# Patient Record
Sex: Male | Born: 1990 | Hispanic: Yes | Marital: Single | Smoking: Current every day smoker
Health system: Southern US, Community
[De-identification: ages and names within clinical notes are randomized; demographics above are authoritative.]

## PROBLEM LIST (undated history)

## (undated) DIAGNOSIS — S82839A Other fracture of upper and lower end of unspecified fibula, initial encounter for closed fracture: Secondary | ICD-10-CM

## (undated) HISTORY — PX: NO PAST SURGERIES: SHX2092

---

## 2016-06-09 ENCOUNTER — Emergency Department (HOSPITAL_COMMUNITY)
Admission: EM | Admit: 2016-06-09 | Discharge: 2016-06-09 | Disposition: A | Discharge status: Home / Self Care | Payer: Self-pay | Attending: Emergency Medicine | Admitting: Emergency Medicine

## 2016-06-09 ENCOUNTER — Emergency Department (HOSPITAL_COMMUNITY): Payer: Self-pay

## 2016-06-09 ENCOUNTER — Encounter (HOSPITAL_COMMUNITY): Payer: Self-pay | Admitting: Emergency Medicine

## 2016-06-09 DIAGNOSIS — Y939 Activity, unspecified: Secondary | ICD-10-CM | POA: Insufficient documentation

## 2016-06-09 DIAGNOSIS — Y929 Unspecified place or not applicable: Secondary | ICD-10-CM | POA: Insufficient documentation

## 2016-06-09 DIAGNOSIS — S43004A Unspecified dislocation of right shoulder joint, initial encounter: Secondary | ICD-10-CM

## 2016-06-09 DIAGNOSIS — Y999 Unspecified external cause status: Secondary | ICD-10-CM | POA: Insufficient documentation

## 2016-06-09 DIAGNOSIS — W11XXXA Fall on and from ladder, initial encounter: Secondary | ICD-10-CM | POA: Insufficient documentation

## 2016-06-09 DIAGNOSIS — S43101A Unspecified dislocation of right acromioclavicular joint, initial encounter: Secondary | ICD-10-CM | POA: Insufficient documentation

## 2016-06-09 DIAGNOSIS — S0001XA Abrasion of scalp, initial encounter: Secondary | ICD-10-CM | POA: Insufficient documentation

## 2016-06-09 DIAGNOSIS — F17210 Nicotine dependence, cigarettes, uncomplicated: Secondary | ICD-10-CM | POA: Insufficient documentation

## 2016-06-09 MED ORDER — HYDROCODONE-ACETAMINOPHEN 5-325 MG PO TABS: ORAL_TABLET | ORAL | 0 refills | Status: DC

## 2016-06-09 MED ORDER — HYDROMORPHONE HCL 1 MG/ML IJ SOLN
1.0000 mg | Freq: Once | INTRAMUSCULAR | Status: AC
  Administered 2016-06-09: 1 mg via INTRAVENOUS
  Filled 2016-06-09: qty 1

## 2016-06-09 MED ORDER — FENTANYL CITRATE (PF) 100 MCG/2ML IJ SOLN
INTRAMUSCULAR | Status: AC
  Administered 2016-06-09: 50 ug via INTRAVENOUS
  Filled 2016-06-09: qty 2

## 2016-06-09 MED ORDER — METHOCARBAMOL 500 MG PO TABS: 1000.0000 mg | ORAL_TABLET | Freq: Four times a day (QID) | ORAL | 0 refills | Status: DC | PRN

## 2016-06-09 MED ORDER — HYDROMORPHONE HCL 1 MG/ML IJ SOLN
1.0000 mg | Freq: Once | INTRAMUSCULAR | Status: AC
  Administered 2016-06-09: 1 mg via INTRAVENOUS

## 2016-06-09 MED ORDER — FENTANYL CITRATE (PF) 100 MCG/2ML IJ SOLN
50.0000 ug | Freq: Once | INTRAMUSCULAR | Status: AC
  Administered 2016-06-09: 50 ug via INTRAVENOUS

## 2016-06-09 MED ORDER — HYDROMORPHONE HCL 1 MG/ML IJ SOLN
INTRAMUSCULAR | Status: AC
  Administered 2016-06-09: 1 mg via INTRAVENOUS
  Filled 2016-06-09: qty 1

## 2016-06-09 NOTE — ED Triage Notes (Signed)
Pt reports falling off of a ladder about 12 feet.  C/o right shoulder pain.
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
## 2016-06-09 NOTE — Discharge Instructions (Signed)
Take the prescriptions as directed.  Apply moist heat or ice to the area(s) of discomfort, for 15 minutes at a time, several times per day for the next few days.  Do not fall asleep on a heating or ice pack. Wear the sling until you are seen in follow up.  Call the Orthopedic doctor today to schedule a follow up appointment within the next week.  Return to the Emergency Department immediately if worsening.

## 2016-06-09 NOTE — ED Notes (Signed)
Pt in CT at this time.

## 2017-10-12 ENCOUNTER — Emergency Department (HOSPITAL_COMMUNITY): Payer: Medicaid Other

## 2017-10-12 ENCOUNTER — Other Ambulatory Visit: Payer: Self-pay

## 2017-10-12 ENCOUNTER — Encounter (HOSPITAL_COMMUNITY): Payer: Self-pay

## 2017-10-12 DIAGNOSIS — Z5321 Procedure and treatment not carried out due to patient leaving prior to being seen by health care provider: Secondary | ICD-10-CM | POA: Insufficient documentation

## 2017-10-12 DIAGNOSIS — M25511 Pain in right shoulder: Secondary | ICD-10-CM | POA: Insufficient documentation

## 2017-10-12 DIAGNOSIS — M25572 Pain in left ankle and joints of left foot: Secondary | ICD-10-CM | POA: Insufficient documentation
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
## 2017-10-13 ENCOUNTER — Other Ambulatory Visit: Payer: Self-pay

## 2017-10-13 ENCOUNTER — Emergency Department (HOSPITAL_COMMUNITY)
Admission: EM | Admit: 2017-10-13 | Discharge: 2017-10-13 | Disposition: A | Discharge status: Home / Self Care | Payer: Medicaid Other | Attending: Emergency Medicine | Admitting: Emergency Medicine

## 2017-10-13 ENCOUNTER — Encounter (HOSPITAL_COMMUNITY): Payer: Self-pay

## 2017-10-13 ENCOUNTER — Emergency Department (HOSPITAL_COMMUNITY): Payer: Medicaid Other

## 2017-10-13 ENCOUNTER — Emergency Department (HOSPITAL_COMMUNITY)
Admission: EM | Admit: 2017-10-13 | Discharge: 2017-10-13 | Disposition: A | Discharge status: Home / Self Care | Payer: Medicaid Other | Source: Home / Self Care

## 2017-10-13 DIAGNOSIS — Y9389 Activity, other specified: Secondary | ICD-10-CM | POA: Insufficient documentation

## 2017-10-13 DIAGNOSIS — S89392A Other physeal fracture of lower end of left fibula, initial encounter for closed fracture: Secondary | ICD-10-CM | POA: Diagnosis not present

## 2017-10-13 DIAGNOSIS — F17210 Nicotine dependence, cigarettes, uncomplicated: Secondary | ICD-10-CM | POA: Diagnosis not present

## 2017-10-13 DIAGNOSIS — X500XXA Overexertion from strenuous movement or load, initial encounter: Secondary | ICD-10-CM | POA: Insufficient documentation

## 2017-10-13 DIAGNOSIS — Y9289 Other specified places as the place of occurrence of the external cause: Secondary | ICD-10-CM | POA: Insufficient documentation

## 2017-10-13 DIAGNOSIS — S82892A Other fracture of left lower leg, initial encounter for closed fracture: Secondary | ICD-10-CM

## 2017-10-13 DIAGNOSIS — Y999 Unspecified external cause status: Secondary | ICD-10-CM | POA: Insufficient documentation

## 2017-10-13 DIAGNOSIS — S99912A Unspecified injury of left ankle, initial encounter: Secondary | ICD-10-CM | POA: Diagnosis present

## 2017-10-13 DIAGNOSIS — S82839A Other fracture of upper and lower end of unspecified fibula, initial encounter for closed fracture: Secondary | ICD-10-CM

## 2017-10-13 HISTORY — DX: Other fracture of upper and lower end of unspecified fibula, initial encounter for closed fracture: S82.839A

## 2017-10-13 MED ORDER — TRAMADOL HCL 50 MG PO TABS: 50.0000 mg | ORAL_TABLET | Freq: Four times a day (QID) | ORAL | 0 refills | Status: DC | PRN

## 2017-10-13 MED ORDER — KETOROLAC TROMETHAMINE 30 MG/ML IJ SOLN
15.0000 mg | Freq: Once | INTRAMUSCULAR | Status: AC
  Administered 2017-10-13: 15 mg via INTRAMUSCULAR
  Filled 2017-10-13: qty 1

## 2017-10-13 MED ORDER — IBUPROFEN 400 MG PO TABS: 400.0000 mg | ORAL_TABLET | Freq: Three times a day (TID) | ORAL | 0 refills | Status: AC

## 2017-10-13 NOTE — Discharge Instructions (Signed)
As discussed, with your ankle fracture is very important that you follow-up with our orthopedic physicians, and wear your provided cam walker while moving the ankle.  Please monitor your condition carefully, and return here for concerning changes.
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
## 2017-10-13 NOTE — ED Notes (Signed)
Unable to make follow up call  No phone number given 10/13/17  1408  s Jaizon Deroos rn
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
## 2017-10-17 ENCOUNTER — Encounter (HOSPITAL_COMMUNITY): Payer: Self-pay

## 2017-10-20 ENCOUNTER — Other Ambulatory Visit: Payer: Self-pay

## 2017-10-20 ENCOUNTER — Other Ambulatory Visit: Payer: Self-pay | Admitting: Orthopedic Surgery

## 2017-10-20 ENCOUNTER — Encounter (HOSPITAL_BASED_OUTPATIENT_CLINIC_OR_DEPARTMENT_OTHER): Payer: Self-pay | Admitting: *Deleted

## 2017-10-23 ENCOUNTER — Ambulatory Visit (HOSPITAL_BASED_OUTPATIENT_CLINIC_OR_DEPARTMENT_OTHER): Payer: Medicaid Other | Admitting: Anesthesiology

## 2017-10-23 ENCOUNTER — Encounter (HOSPITAL_BASED_OUTPATIENT_CLINIC_OR_DEPARTMENT_OTHER)
Admission: RE | Disposition: A | Discharge status: Home / Self Care | Payer: Self-pay | Source: Ambulatory Visit | Attending: Orthopedic Surgery

## 2017-10-23 ENCOUNTER — Ambulatory Visit (HOSPITAL_BASED_OUTPATIENT_CLINIC_OR_DEPARTMENT_OTHER)
Admission: RE | Admit: 2017-10-23 | Discharge: 2017-10-23 | Disposition: A | Discharge status: Home / Self Care | Payer: Medicaid Other | Source: Ambulatory Visit | Attending: Orthopedic Surgery | Admitting: Orthopedic Surgery

## 2017-10-23 ENCOUNTER — Other Ambulatory Visit: Payer: Self-pay

## 2017-10-23 ENCOUNTER — Encounter (HOSPITAL_BASED_OUTPATIENT_CLINIC_OR_DEPARTMENT_OTHER): Payer: Self-pay

## 2017-10-23 DIAGNOSIS — F17210 Nicotine dependence, cigarettes, uncomplicated: Secondary | ICD-10-CM | POA: Insufficient documentation

## 2017-10-23 DIAGNOSIS — Z791 Long term (current) use of non-steroidal anti-inflammatories (NSAID): Secondary | ICD-10-CM | POA: Insufficient documentation

## 2017-10-23 DIAGNOSIS — W19XXXA Unspecified fall, initial encounter: Secondary | ICD-10-CM | POA: Insufficient documentation

## 2017-10-23 DIAGNOSIS — S8262XA Displaced fracture of lateral malleolus of left fibula, initial encounter for closed fracture: Secondary | ICD-10-CM | POA: Diagnosis not present

## 2017-10-23 DIAGNOSIS — S82452A Displaced comminuted fracture of shaft of left fibula, initial encounter for closed fracture: Secondary | ICD-10-CM

## 2017-10-23 HISTORY — PX: ORIF ANKLE FRACTURE: SHX5408

## 2017-10-23 HISTORY — DX: Other fracture of upper and lower end of unspecified fibula, initial encounter for closed fracture: S82.839A

## 2017-10-23 SURGERY — OPEN REDUCTION INTERNAL FIXATION (ORIF) ANKLE FRACTURE
Anesthesia: General | Site: Ankle | Laterality: Left

## 2017-10-23 MED ORDER — PROPOFOL 10 MG/ML IV BOLUS
INTRAVENOUS | Status: AC
  Filled 2017-10-23: qty 20

## 2017-10-23 MED ORDER — OXYCODONE-ACETAMINOPHEN 5-325 MG PO TABS: 1.0000 | ORAL_TABLET | Freq: Four times a day (QID) | ORAL | 0 refills | Status: DC | PRN

## 2017-10-23 MED ORDER — SCOPOLAMINE 1 MG/3DAYS TD PT72: 1.0000 | MEDICATED_PATCH | Freq: Once | TRANSDERMAL | Status: DC | PRN

## 2017-10-23 MED ORDER — PROPOFOL 10 MG/ML IV BOLUS
INTRAVENOUS | Status: DC | PRN
  Administered 2017-10-23: 30 mg via INTRAVENOUS
  Administered 2017-10-23: 300 mg via INTRAVENOUS

## 2017-10-23 MED ORDER — ONDANSETRON HCL 4 MG/2ML IJ SOLN
INTRAMUSCULAR | Status: AC
  Filled 2017-10-23: qty 2

## 2017-10-23 MED ORDER — PHENYLEPHRINE HCL 10 MG/ML IJ SOLN
INTRAMUSCULAR | Status: DC | PRN
  Administered 2017-10-23: 80 ug via INTRAVENOUS

## 2017-10-23 MED ORDER — ROPIVACAINE HCL 7.5 MG/ML IJ SOLN
INTRAMUSCULAR | Status: DC | PRN
  Administered 2017-10-23: 25 mL via PERINEURAL
  Administered 2017-10-23: 15 mL via PERINEURAL

## 2017-10-23 MED ORDER — CEFAZOLIN SODIUM-DEXTROSE 2-4 GM/100ML-% IV SOLN: 2.0000 g | INTRAVENOUS | Status: DC

## 2017-10-23 MED ORDER — ASPIRIN EC 325 MG PO TBEC: 325.0000 mg | DELAYED_RELEASE_TABLET | Freq: Every day | ORAL | 0 refills | Status: AC

## 2017-10-23 MED ORDER — MIDAZOLAM HCL 2 MG/2ML IJ SOLN
1.0000 mg | INTRAMUSCULAR | Status: DC | PRN
  Administered 2017-10-23 (×2): 2 mg via INTRAVENOUS

## 2017-10-23 MED ORDER — OXYCODONE HCL 5 MG PO TABS: 5.0000 mg | ORAL_TABLET | Freq: Once | ORAL | Status: DC | PRN

## 2017-10-23 MED ORDER — OXYCODONE HCL 5 MG/5ML PO SOLN: 5.0000 mg | Freq: Once | ORAL | Status: DC | PRN

## 2017-10-23 MED ORDER — LIDOCAINE HCL (CARDIAC) PF 100 MG/5ML IV SOSY
PREFILLED_SYRINGE | INTRAVENOUS | Status: DC | PRN
  Administered 2017-10-23: 100 mg via INTRAVENOUS

## 2017-10-23 MED ORDER — MIDAZOLAM HCL 2 MG/2ML IJ SOLN
INTRAMUSCULAR | Status: AC
  Filled 2017-10-23: qty 2

## 2017-10-23 MED ORDER — ONDANSETRON HCL 4 MG/2ML IJ SOLN
INTRAMUSCULAR | Status: DC | PRN
  Administered 2017-10-23: 4 mg via INTRAVENOUS

## 2017-10-23 MED ORDER — LIDOCAINE HCL (CARDIAC) PF 100 MG/5ML IV SOSY
PREFILLED_SYRINGE | INTRAVENOUS | Status: AC
  Filled 2017-10-23: qty 5

## 2017-10-23 MED ORDER — CHLORHEXIDINE GLUCONATE 4 % EX LIQD: 60.0000 mL | Freq: Once | CUTANEOUS | Status: DC

## 2017-10-23 MED ORDER — FENTANYL CITRATE (PF) 100 MCG/2ML IJ SOLN: 25.0000 ug | INTRAMUSCULAR | Status: DC | PRN

## 2017-10-23 MED ORDER — DEXAMETHASONE SODIUM PHOSPHATE 4 MG/ML IJ SOLN
INTRAMUSCULAR | Status: DC | PRN
  Administered 2017-10-23: 10 mg via INTRAVENOUS

## 2017-10-23 MED ORDER — OXYCODONE-ACETAMINOPHEN 5-325 MG PO TABS: 1.0000 | ORAL_TABLET | ORAL | 0 refills | Status: DC | PRN

## 2017-10-23 MED ORDER — CEFAZOLIN SODIUM-DEXTROSE 2-4 GM/100ML-% IV SOLN
INTRAVENOUS | Status: AC
  Filled 2017-10-23: qty 100

## 2017-10-23 MED ORDER — FENTANYL CITRATE (PF) 100 MCG/2ML IJ SOLN
INTRAMUSCULAR | Status: AC
  Filled 2017-10-23: qty 2

## 2017-10-23 MED ORDER — DEXAMETHASONE SODIUM PHOSPHATE 10 MG/ML IJ SOLN
INTRAMUSCULAR | Status: AC
  Filled 2017-10-23: qty 1

## 2017-10-23 MED ORDER — SUCCINYLCHOLINE CHLORIDE 20 MG/ML IJ SOLN
INTRAMUSCULAR | Status: DC | PRN
  Administered 2017-10-23: 140 mg via INTRAVENOUS

## 2017-10-23 MED ORDER — FENTANYL CITRATE (PF) 100 MCG/2ML IJ SOLN
50.0000 ug | INTRAMUSCULAR | Status: DC | PRN
  Administered 2017-10-23: 100 ug via INTRAVENOUS
  Administered 2017-10-23: 50 ug via INTRAVENOUS

## 2017-10-23 MED ORDER — LACTATED RINGERS IV SOLN
INTRAVENOUS | Status: DC
  Administered 2017-10-23 (×2): via INTRAVENOUS

## 2017-10-23 MED ORDER — LACTATED RINGERS IV SOLN: INTRAVENOUS | Status: DC

## 2017-10-23 MED ORDER — PROMETHAZINE HCL 25 MG/ML IJ SOLN: 6.2500 mg | INTRAMUSCULAR | Status: DC | PRN

## 2017-10-23 SURGICAL SUPPLY — 75 items
BANDAGE ACE 4X5 VEL STRL LF (GAUZE/BANDAGES/DRESSINGS) ×3 IMPLANT
BANDAGE ACE 6X5 VEL STRL LF (GAUZE/BANDAGES/DRESSINGS) ×3 IMPLANT
BANDAGE ESMARK 6X9 LF (GAUZE/BANDAGES/DRESSINGS) ×1 IMPLANT
BENZOIN TINCTURE PRP APPL 2/3 (GAUZE/BANDAGES/DRESSINGS) IMPLANT
BIT DRILL 2.5X2.75 QC CALB (BIT) ×3 IMPLANT
BIT DRILL 3.5X5.5 QC CALB (BIT) ×3 IMPLANT
BLADE SURG 15 STRL LF DISP TIS (BLADE) ×1 IMPLANT
BLADE SURG 15 STRL SS (BLADE) ×2
BNDG ESMARK 4X9 LF (GAUZE/BANDAGES/DRESSINGS) IMPLANT
BNDG ESMARK 6X9 LF (GAUZE/BANDAGES/DRESSINGS) ×3
CANISTER SUCT 1200ML W/VALVE (MISCELLANEOUS) IMPLANT
CLOSURE WOUND 1/2 X4 (GAUZE/BANDAGES/DRESSINGS)
COVER BACK TABLE 60X90IN (DRAPES) ×3 IMPLANT
CUFF TOURNIQUET SINGLE 18IN (TOURNIQUET CUFF) IMPLANT
DECANTER SPIKE VIAL GLASS SM (MISCELLANEOUS) IMPLANT
DRAPE EXTREMITY T 121X128X90 (DRAPE) ×3 IMPLANT
DRAPE IMP U-DRAPE 54X76 (DRAPES) ×3 IMPLANT
DRAPE OEC MINIVIEW 54X84 (DRAPES) ×3 IMPLANT
DRAPE U-SHAPE 47X51 STRL (DRAPES) ×3 IMPLANT
DRSG EMULSION OIL 3X3 NADH (GAUZE/BANDAGES/DRESSINGS) ×3 IMPLANT
DURAPREP 26ML APPLICATOR (WOUND CARE) ×3 IMPLANT
ELECT REM PT RETURN 9FT ADLT (ELECTROSURGICAL) ×3
ELECTRODE REM PT RTRN 9FT ADLT (ELECTROSURGICAL) ×1 IMPLANT
GAUZE SPONGE 4X4 12PLY STRL (GAUZE/BANDAGES/DRESSINGS) ×3 IMPLANT
GAUZE SPONGE 4X4 16PLY XRAY LF (GAUZE/BANDAGES/DRESSINGS) IMPLANT
GLOVE BIOGEL M STRL SZ7.5 (GLOVE) ×3 IMPLANT
GLOVE BIOGEL PI IND STRL 8 (GLOVE) ×3 IMPLANT
GLOVE BIOGEL PI INDICATOR 8 (GLOVE) ×6
GLOVE ECLIPSE 7.5 STRL STRAW (GLOVE) ×9 IMPLANT
GOWN STRL REUS W/ TWL LRG LVL3 (GOWN DISPOSABLE) IMPLANT
GOWN STRL REUS W/ TWL XL LVL3 (GOWN DISPOSABLE) ×1 IMPLANT
GOWN STRL REUS W/TWL LRG LVL3 (GOWN DISPOSABLE)
GOWN STRL REUS W/TWL XL LVL3 (GOWN DISPOSABLE) ×5 IMPLANT
NEEDLE HYPO 22GX1.5 SAFETY (NEEDLE) IMPLANT
NS IRRIG 1000ML POUR BTL (IV SOLUTION) ×3 IMPLANT
PACK BASIN DAY SURGERY FS (CUSTOM PROCEDURE TRAY) ×3 IMPLANT
PAD CAST 4YDX4 CTTN HI CHSV (CAST SUPPLIES) ×1 IMPLANT
PADDING CAST ABS 4INX4YD NS (CAST SUPPLIES) ×2
PADDING CAST ABS COTTON 4X4 ST (CAST SUPPLIES) ×1 IMPLANT
PADDING CAST COTTON 4X4 STRL (CAST SUPPLIES) ×2
PADDING CAST COTTON 6X4 STRL (CAST SUPPLIES) IMPLANT
PENCIL BUTTON HOLSTER BLD 10FT (ELECTRODE) ×3 IMPLANT
PLATE ACE 100DEG 6HOLE (Plate) ×3 IMPLANT
SCREW CORTICAL 3.5MM  12MM (Screw) ×2 IMPLANT
SCREW CORTICAL 3.5MM  20MM (Screw) ×2 IMPLANT
SCREW CORTICAL 3.5MM 12MM (Screw) ×1 IMPLANT
SCREW CORTICAL 3.5MM 14MM (Screw) ×6 IMPLANT
SCREW CORTICAL 3.5MM 20MM (Screw) ×1 IMPLANT
SCREW CORTICAL 3.5MM 22MM (Screw) ×3 IMPLANT
SCREW NLOCK CANC HEX 4X14 (Screw) ×3 IMPLANT
SCREW NLOCK CANC HEX 4X18 (Screw) ×2 IMPLANT
SCREW NLOCK CANC HEX 4X18 FIB (Screw) ×1 IMPLANT
SPLINT FAST PLASTER 5X30 (CAST SUPPLIES)
SPLINT FIBERGLASS 4X30 (CAST SUPPLIES) ×6 IMPLANT
SPLINT PLASTER CAST FAST 5X30 (CAST SUPPLIES) IMPLANT
SPONGE LAP 4X18 RFD (DISPOSABLE) IMPLANT
STAPLER VISISTAT (STAPLE) IMPLANT
STOCKINETTE 6  STRL (DRAPES) ×2
STOCKINETTE 6 STRL (DRAPES) ×1 IMPLANT
STRIP CLOSURE SKIN 1/2X4 (GAUZE/BANDAGES/DRESSINGS) IMPLANT
SUCTION FRAZIER HANDLE 10FR (MISCELLANEOUS)
SUCTION TUBE FRAZIER 10FR DISP (MISCELLANEOUS) IMPLANT
SUT ETHILON 3 0 PS 1 (SUTURE) IMPLANT
SUT ETHILON 4 0 PS 2 18 (SUTURE) IMPLANT
SUT MON AB 4-0 PC3 18 (SUTURE) IMPLANT
SUT VIC AB 2-0 SH 27 (SUTURE) ×2
SUT VIC AB 2-0 SH 27XBRD (SUTURE) ×1 IMPLANT
SUT VIC AB 3-0 FS2 27 (SUTURE) IMPLANT
SUT VICRYL 4-0 PS2 18IN ABS (SUTURE) IMPLANT
SYR 20CC LL (SYRINGE) IMPLANT
SYR BULB 3OZ (MISCELLANEOUS) ×3 IMPLANT
TOWEL OR NON WOVEN STRL DISP B (DISPOSABLE) ×3 IMPLANT
TUBE CONNECTING 20'X1/4 (TUBING)
TUBE CONNECTING 20X1/4 (TUBING) IMPLANT
UNDERPAD 30X30 (UNDERPADS AND DIAPERS) ×3 IMPLANT

## 2017-10-23 NOTE — Anesthesia Procedure Notes (Signed)
Anesthesia Regional Block: Popliteal block   Pre-Anesthetic Checklist: ,, timeout performed, Correct Patient, Correct Site, Correct Laterality, Correct Procedure, Correct Position, site marked, Risks and benefits discussed,  Surgical consent,  Pre-op evaluation,  At surgeon's request and post-op pain management  Laterality: Left  Prep: chloraprep       Needles:  Injection technique: Single-shot  Needle Type: Echogenic Needle     Needle Length: 10cm  Needle Gauge: 21     Additional Needles:   Narrative:  Start time: 10/23/2017 1:04 PM End time: 10/23/2017 1:07 PM Injection made incrementally with aspirations every 5 mL.  Performed by: Personally  Anesthesiologist: Beryle LatheBrock, Thomas E, MD  Additional Notes: No pain on injection. No increased resistance to injection. Injection made in 5cc increments. Good needle visualization. Patient tolerated the procedure well.

## 2017-10-23 NOTE — H&P (Signed)
PREOPERATIVE H&P  Chief Complaint: Left ankle pain  HPI: Bryce Lee is a 27 y.o. male who presents for evaluation of left ankle pain. It has been present for 5 days and has been worsening. He has failed conservative measures. Pain is rated as moderate.  Past Medical History:  Diagnosis Date  . Fracture of distal fibula 10/13/2017   left   Past Surgical History:  Procedure Laterality Date  . NO PAST SURGERIES     Social History   Socioeconomic History  . Marital status: Single    Spouse name: Not on file  . Number of children: Not on file  . Years of education: Not on file  . Highest education level: Not on file  Occupational History  . Not on file  Social Needs  . Financial resource strain: Not on file  . Food insecurity:    Worry: Not on file    Inability: Not on file  . Transportation needs:    Medical: Not on file    Non-medical: Not on file  Tobacco Use  . Smoking status: Current Every Day Smoker    Packs/day: 0.50    Years: 13.00    Pack years: 6.50    Types: Cigarettes  . Smokeless tobacco: Never Used  . Tobacco comment: pt cut down to half pack per day  Substance and Sexual Activity  . Alcohol use: Yes    Comment: occasionally  . Drug use: No  . Sexual activity: Not on file  Lifestyle  . Physical activity:    Days per week: Not on file    Minutes per session: Not on file  . Stress: Not on file  Relationships  . Social connections:    Talks on phone: Not on file    Gets together: Not on file    Attends religious service: Not on file    Active member of club or organization: Not on file    Attends meetings of clubs or organizations: Not on file    Relationship status: Not on file  Other Topics Concern  . Not on file  Social History Narrative   ** Merged History Encounter **       History reviewed. No pertinent family history. No Known Allergies Prior to Admission medications   Medication Sig Start Date End Date Taking? Authorizing  Provider  ibuprofen (ADVIL,MOTRIN) 400 MG tablet Take 400 mg by mouth every 6 (six) hours as needed.   Yes [provider]  oxyCODONE-acetaminophen (PERCOCET/ROXICET) 5-325 MG tablet Take by mouth every 4 (four) hours as needed for severe pain.   Yes [provider]  traMADol (ULTRAM) 50 MG tablet Take 1 tablet (50 mg total) by mouth every 6 (six) hours as needed. 10/13/17  Yes Gerhard MunchLockwood, Robert, MD     Positive ROS: None  All other systems have been reviewed and were otherwise negative with the exception of those mentioned in the HPI and as above.  Physical Exam: Vitals:   10/23/17 1315 10/23/17 1320  BP: 120/78   Pulse: 71 65  Resp: 20 12  Temp:    SpO2: 96% 97%    General: Alert, no acute distress Cardiovascular: No pedal edema Respiratory: No cyanosis, no use of accessory musculature GI: No organomegaly, abdomen is soft and non-tender Skin: No lesions in the area of chief complaint Neurologic: Sensation intact distally Psychiatric: Patient is competent for consent with normal mood and affect Lymphatic: No axillary or cervical lymphadenopathy  MUSCULOSKELETAL: Left ankle: Painful range of motion.  Limited range of motion.  Significant soft tissue swelling.  X-ray: X-rays show lateral ankle fracture with displaced ankle mortise  Assessment/Plan: LEFT DISTAL FIBULA FRACTURE with possible syndesmotic fixation if needed Plan for Procedure(s): OPEN REDUCTION INTERNAL FIXATION (ORIF) ANKLE FRACTURE  The risks benefits and alternatives were discussed with the patient including but not limited to the risks of nonoperative treatment, versus surgical intervention including infection, bleeding, nerve injury, malunion, nonunion, hardware prominence, hardware failure, need for hardware removal, blood clots, cardiopulmonary complications, morbidity, mortality, among others, and they were willing to proceed.  Predicted outcome is good, although there will be at least a six  to nine month expected recovery.  Harvie Junior, MD 10/23/2017 1:41 PM

## 2017-10-23 NOTE — Transfer of Care (Signed)
Immediate Anesthesia Transfer of Care Note  Patient: Bryce Lee  Procedure(s) Performed: OPEN REDUCTION INTERNAL FIXATION (ORIF) ANKLE FRACTURE (Left Ankle)  Patient Location: PACU  Anesthesia Type:General  Level of Consciousness: awake, alert  and oriented  Airway & Oxygen Therapy: Patient Spontanous Breathing and Patient connected to face mask oxygen  Post-op Assessment: Report given to RN and Post -op Vital signs reviewed and stable  Post vital signs: Reviewed and stable  Last Vitals:  Vitals Value Taken Time  BP    Temp    Pulse 74 10/23/2017  3:09 PM  Resp 15 10/23/2017  3:09 PM  SpO2 98 % 10/23/2017  3:09 PM  Vitals shown include unvalidated device data.  Last Pain:  Vitals:   10/23/17 1222  TempSrc: Oral  PainSc: 0-No pain         Complications: No apparent anesthesia complications

## 2017-10-23 NOTE — Brief Op Note (Signed)
10/23/2017  2:35 PM  PATIENT:  Edgardo RoysGabriel Aguirre-Serrano  27 y.o. male  PRE-OPERATIVE DIAGNOSIS:  LEFT DISTAL FIBULA FRACTURE  POST-OPERATIVE DIAGNOSIS:  Left Distal Fibula Fracture  PROCEDURE:  Procedure(s): OPEN REDUCTION INTERNAL FIXATION (ORIF) ANKLE FRACTURE (Left)  SURGEON:  Surgeon(s) and Role:    Jodi Geralds* Dhiren Azimi, MD - Primary  PHYSICIAN ASSISTANT:   ASSISTANTS: Gus PumaJim Bethune PA  ANESTHESIA:   general  EBL:  Minimal    BLOOD ADMINISTERED:none  DRAINS: none   LOCAL MEDICATIONS USED:  MARCAINE     SPECIMEN:  No Specimen  DISPOSITION OF SPECIMEN:  N/A  COUNTS:  YES  TOURNIQUET:  * Missing tourniquet times found for documented tourniquets in log: [id number] *  DICTATION: .Other Dictation: Dictation Number [id number]  PLAN OF CARE: Discharge to home after PACU  PATIENT DISPOSITION:  PACU - hemodynamically stable.   Delay start of Pharmacological VTE agent (>24hrs) due to surgical blood loss or risk of bleeding: no

## 2017-10-23 NOTE — Anesthesia Preprocedure Evaluation (Addendum)
Anesthesia Evaluation  Patient identified by MRN, date of birth, ID band Patient awake    Reviewed: Allergy & Precautions, NPO status , Patient's Chart, lab work & pertinent test results  History of Anesthesia Complications Negative for: history of anesthetic complications  Airway Mallampati: II  TM Distance: >3 FB Neck ROM: Full    Dental  (+) Dental Advisory Given, Teeth Intact   Pulmonary Current Smoker,    breath sounds clear to auscultation       Cardiovascular negative cardio ROS   Rhythm:Regular Rate:Normal     Neuro/Psych negative neurological ROS  negative psych ROS   GI/Hepatic negative GI ROS, Neg liver ROS,   Endo/Other  negative endocrine ROS  Renal/GU negative Renal ROS  negative genitourinary   Musculoskeletal negative musculoskeletal ROS (+)   Abdominal   Peds  Hematology negative hematology ROS (+)   Anesthesia Other Findings   Reproductive/Obstetrics                            Anesthesia Physical Anesthesia Plan  ASA: II  Anesthesia Plan: General   Post-op Pain Management:  Regional for Post-op pain   Induction:   PONV Risk Score and Plan: 2 and Ondansetron, Treatment may vary due to age or medical condition, Dexamethasone and Midazolam  Airway Management Planned: LMA  Additional Equipment: None  Intra-op Plan:   Post-operative Plan: Extubation in OR  Informed Consent: I have reviewed the patients History and Physical, chart, labs and discussed the procedure including the risks, benefits and alternatives for the proposed anesthesia with the patient or authorized representative who has indicated his/her understanding and acceptance.   Dental advisory given  Plan Discussed with: CRNA and Anesthesiologist  Anesthesia Plan Comments:        Anesthesia Quick Evaluation

## 2017-10-23 NOTE — Progress Notes (Signed)
Assisted Dr. Brock with left, ultrasound guided, popliteal/saphenous block. Side rails up, monitors on throughout procedure. See vital signs in flow sheet. Tolerated Procedure well. 

## 2017-10-23 NOTE — Anesthesia Procedure Notes (Signed)
Procedure Name: Intubation Performed by: Verita Lamb, CRNA Pre-anesthesia Checklist: Patient identified, Emergency Drugs available, Suction available, Patient being monitored and Timeout performed Patient Re-evaluated:Patient Re-evaluated prior to induction Oxygen Delivery Method: Circle system utilized Preoxygenation: Pre-oxygenation with 100% oxygen Induction Type: IV induction Laryngoscope Size: Mac and 4 Grade View: Grade I Tube type: Oral Tube size: 7.0 mm Number of attempts: 1 Airway Equipment and Method: Stylet Placement Confirmation: positive ETCO2,  ETT inserted through vocal cords under direct vision,  breath sounds checked- equal and bilateral and CO2 detector Secured at: 22 cm Tube secured with: Tape Dental Injury: Teeth and Oropharynx as per pre-operative assessment  Comments: Pt expressed feelings of nausea prior to induction.  Turned pt head to the side and suctioned. Emesis noted.  Clear colorless emesis noted.  Zofran given and decided to do rsi induction.  Cricoid pressure maintained during induction with no ventilation.  Grade 1 view with clear hypopharynx.  ett inserted atraumatically, cuff inflated and cricoid released. bbs noted with +etco2

## 2017-10-23 NOTE — Op Note (Signed)
NAME: Bryce Lee, [name] AGUIRRE MEDICAL RECORD [id number] ACCOUNT [id number] DATE OF BIRTH:1990/09/20 FACILITY: [facility] LOCATION: [facility] PHYSICIAN:Myleka Moncure L. Kailash Hinze, MD  OPERATIVE REPORT  DATE OF PROCEDURE:  10/23/2017  PREOPERATIVE DIAGNOSIS:  Displaced lateral fibular fracture.  POSTOPERATIVE DIAGNOSIS:  Displaced lateral fibular fracture.  PROCEDURE:  1.   Open reduction internal fixation of displaced lateral fibular fracture with mortise widening. 2.  Interpretation of multiple intraoperative fluoroscopic images.  SURGEON:  Jodi GeraldsJohn Kenae Lindquist, MD  ASSISTANT:  Coral CeoKarl Bethune, PA  ANESTHESIA:  General.  BRIEF HISTORY:  The patient is a 27 year old male with a long history significant complaints of left ankle pain after a fall.  He was seen in the emergency room with a displaced ankle fracture.  We had a long discussion of treatment options.  We felt  that open reduction internal fixation will be the most appropriate course of action given the widened mortise and we were prepared to do a syndesmotic fixation if necessary.  He was brought to the operating room for this procedure.  DESCRIPTION OF PROCEDURE:  The patient brought to the operating room after adequate anesthesia obtained with general anesthetic, the patient brought to the operating table.  The left leg was then prepped and draped in the usual sterile fashion.   Following this, the leg was exsanguinated blood pressure tourniquet inflated to 300 mmHg.  Following this, an incision was made over the lateral aspect of the ankle to subcutaneous tissue down to the level of the fracture fragments.  The fracture was  identified, cleansed of healing elements manipulative closed reduction was undertaken with a clamp.  Following this, an interfragmentary screw was placed followed by placement of a 6-hole one-third tubular place with 3 proximal screws and 2 cancellous  distal screws with an interfragmentary screw over where the third  hole was distally.  At this point, fluoroscopic images were taken and noted to have excellent reduction and screw lengths of all areas and following this, the wounds were irrigated and  suctioned dry, closed in layers.  Sterile compressive dressing was applied and the patient was taken to recovery was noted to be in satisfactory condition.    Estimated blood loss for procedure was minimal.  AN/NUANCE  D:10/23/2017 T:10/23/2017 JOB:[id number]

## 2017-10-23 NOTE — Discharge Instructions (Signed)
Elevate your left leg is much as possible. Apply ice to your left foot and ankle. Keep splint dry. Ambulate nonweightbearing on the left with crutches. You may wiggle your toes as tolerated.  Regional Anesthesia Blocks  1. Numbness or the inability to move the "blocked" extremity may last from 3-48 hours after placement. The length of time depends on the medication injected and your individual response to the medication. If the numbness is not going away after 48 hours, call your surgeon.  2. The extremity that is blocked will need to be protected until the numbness is gone and the  Strength has returned. Because you cannot feel it, you will need to take extra care to avoid injury. Because it may be weak, you may have difficulty moving it or using it. You may not know what position it is in without looking at it while the block is in effect.  3. For blocks in the legs and feet, returning to weight bearing and walking needs to be done carefully. You will need to wait until the numbness is entirely gone and the strength has returned. You should be able to move your leg and foot normally before you try and bear weight or walk. You will need someone to be with you when you first try to ensure you do not fall and possibly risk injury.  4. Bruising and tenderness at the needle site are common side effects and will resolve in a few days.  5. Persistent numbness or new problems with movement should be communicated to the surgeon or the [facility] [phone])/ [facility] [phone]).   Post Anesthesia Home Care Instructions  Activity: Get plenty of rest for the remainder of the day. A responsible individual must stay with you for 24 hours following the procedure.  For the next 24 hours, DO NOT: -Drive a car -Advertising copywriterperate machinery -Drink alcoholic beverages -Take any medication unless instructed by your physician -Make any legal decisions or sign important  papers.  Meals: Start with liquid foods such as gelatin or soup. Progress to regular foods as tolerated. Avoid greasy, spicy, heavy foods. If nausea and/or vomiting occur, drink only clear liquids until the nausea and/or vomiting subsides. Call your physician if vomiting continues.  Special Instructions/Symptoms: Your throat may feel dry or sore from the anesthesia or the breathing tube placed in your throat during surgery. If this causes discomfort, gargle with warm salt water. The discomfort should disappear within 24 hours.  If you had a scopolamine patch placed behind your ear for the management of post- operative nausea and/or vomiting:  1. The medication in the patch is effective for 72 hours, after which it should be removed.  Wrap patch in a tissue and discard in the trash. Wash hands thoroughly with soap and water. 2. You may remove the patch earlier than 72 hours if you experience unpleasant side effects which may include dry mouth, dizziness or visual disturbances. 3. Avoid touching the patch. Wash your hands with soap and water after contact with the patch.

## 2017-10-23 NOTE — Anesthesia Postprocedure Evaluation (Signed)
Anesthesia Post Note  Patient: Bryce Lee  Procedure(s) Performed: OPEN REDUCTION INTERNAL FIXATION (ORIF) ANKLE FRACTURE (Left Ankle)     Patient location during evaluation: PACU Anesthesia Type: General Level of consciousness: awake and alert Pain management: pain level controlled Vital Signs Assessment: post-procedure vital signs reviewed and stable Respiratory status: spontaneous breathing, nonlabored ventilation and respiratory function stable Cardiovascular status: blood pressure returned to baseline and stable Postop Assessment: no apparent nausea or vomiting Anesthetic complications: no    Last Vitals:  Vitals:   10/23/17 1530 10/23/17 1540  BP: 121/72 (!) 117/92  Pulse: 66 60  Resp: 11 11  Temp:    SpO2: 100% 97%    Last Pain:  Vitals:   10/23/17 1540  TempSrc:   PainSc: 0-No pain    LLE Motor Response: No movement due to regional block (10/23/17 1540) LLE Sensation: Numbness;No tingling;No pain (10/23/17 1540)          Beryle Lathehomas E Cyera Balboni

## 2017-10-23 NOTE — Anesthesia Procedure Notes (Signed)
Anesthesia Regional Block: Adductor canal block   Pre-Anesthetic Checklist: ,, timeout performed, Correct Patient, Correct Site, Correct Laterality, Correct Procedure, Correct Position, site marked, Risks and benefits discussed,  Surgical consent,  Pre-op evaluation,  At surgeon's request and post-op pain management  Laterality: Left  Prep: chloraprep       Needles:  Injection technique: Single-shot  Needle Type: Echogenic Needle     Needle Length: 10cm  Needle Gauge: 21     Additional Needles:   Narrative:  Start time: 10/23/2017 12:59 PM End time: 10/23/2017 1:03 PM Injection made incrementally with aspirations every 5 mL.  Performed by: Personally  Anesthesiologist: Beryle LatheBrock, Jaevin Medearis E, MD  Additional Notes: No pain on injection. No increased resistance to injection. Injection made in 5cc increments. Good needle visualization. Patient tolerated the procedure well.

## 2017-10-24 ENCOUNTER — Encounter (HOSPITAL_BASED_OUTPATIENT_CLINIC_OR_DEPARTMENT_OTHER): Payer: Self-pay | Admitting: Orthopedic Surgery

## 2017-10-24 NOTE — Addendum Note (Signed)
Addendum  created 10/24/17 1102 by Lance CoonWebster, Dooly, CRNA   Charge Capture section accepted

## 2018-04-09 ENCOUNTER — Ambulatory Visit (INDEPENDENT_AMBULATORY_CARE_PROVIDER_SITE_OTHER): Payer: Medicaid Other

## 2018-04-09 ENCOUNTER — Ambulatory Visit: Payer: Medicaid Other | Admitting: Orthopedic Surgery

## 2018-04-09 ENCOUNTER — Encounter: Payer: Self-pay | Admitting: Orthopedic Surgery

## 2018-04-09 VITALS — BP 139/91 | HR 103 | Ht 67.0 in | Wt 201.0 lb

## 2018-04-09 DIAGNOSIS — M25511 Pain in right shoulder: Secondary | ICD-10-CM

## 2018-04-09 DIAGNOSIS — S43101S Unspecified dislocation of right acromioclavicular joint, sequela: Secondary | ICD-10-CM
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
## 2018-04-09 NOTE — Patient Instructions (Signed)
Surgery for Acromioclavicular Separation  Acromioclavicular separation is an injury to the small joint at the top of the shoulder (acromioclavicular joint or AC joint). The [facility] joint connects the outer tip of the collarbone (clavicle) to the top of the shoulder blade (acromion). Two strong cords of tissue (acromioclavicular ligament and coracoclavicular ligament) stretch across the [facility] joint to keep it in place. An AC joint separation happens when one or both ligaments stretch or tear, causing the joint to separate. You may need surgery to repair acromioclavicular separation if your condition is severe, or if other treatment methods have not helped you. The surgery may include using a piece of tissue from your arm (tendon graft) to strengthen the [facility] joint. Tell a health care provider about:  Any allergies you have.  All medicines you are taking, including vitamins, herbs, eye drops, creams, and over-the-counter medicines.  Any problems you or family members have had with anesthetic medicines.  Any blood disorders you have.  Any surgeries you have had.  Any medical conditions you have.  Whether you are pregnant or may be pregnant. What are the risks? Generally, this is a safe procedure. However, problems may occur, including:  Development of arthritis in your shoulder.  Infection.  Bleeding.  Allergic reactions to medicines.  Damage to blood vessels or nerves. What happens before the procedure? Staying hydrated Follow instructions from your health care provider about hydration, which may include:  Up to 2 hours before the procedure - you may continue to drink clear liquids, such as water, clear fruit juice, black coffee, and plain tea. Eating and drinking restrictions Follow instructions from your health care provider about eating and drinking, which may include:  8 hours before the procedure - stop eating heavy meals or foods such as meat, fried foods, or fatty foods.  6 hours  before the procedure - stop eating light meals or foods, such as toast or cereal.  6 hours before the procedure - stop drinking milk or drinks that contain milk.  2 hours before the procedure - stop drinking clear liquids. Other instructions  Ask your health care provider about: ? Changing or stopping your regular medicines. This is especially important if you are taking diabetes medicines or blood thinners. ? Taking medicines such as aspirin and ibuprofen. These medicines can thin your blood. Do not take these medicines before your procedure if your health care provider instructs you not to.  Plan to have someone take you home from the hospital or clinic.  If you will be going home right after the procedure, plan to have someone with you for 24 hours.  Ask your health care provider how your surgical site will be marked or identified.  You may be given antibiotic medicine to help prevent infection. What happens during the procedure?  To reduce your risk of infection: ? Your health care team will wash or sanitize their hands. ? Your skin will be washed with soap.  An IV tube will be inserted into one of your veins.  You will be given one or more of the following: ? A medicine to help you relax (sedative). ? A medicine to make you fall asleep (general anesthetic). ? A medicine that is injected into an area of your body to numb everything below the injection site (regional anesthetic).  An incision will be made on the top of your shoulder.  If you are having a tendon graft: ? An incision will be made in your forearm. ? A piece  of a tendon in your forearm will be removed to use as the graft. ? This incision will be closed with stitches (sutures) and covered with a bandage (dressing).  An incision will be made in the tissues that surround your [facility] joint (shoulder capsule).  Ligaments that can be repaired may be stitched together or attached (anchored) to each side of your [facility] joint  with screws.  A small portion of bone at the tip of your clavicle may be removed.  Holes may be made through the end of your clavicle and through your acromion.  A suture, or the tendon graft, or both will be threaded through the holes and tightened across your [facility] joint to hold your joint together.  The incision in your shoulder capsule will be closed with sutures.  The incision in your skin will be closed with sutures or staples and covered with a dressing.  Your arm will be placed in a sling. The procedure may vary among health care providers and hospitals. What happens after the procedure?  Your blood pressure, heart rate, breathing rate, and blood oxygen level will be monitored often until the medicines you were given have worn off.  You may have some pain. Medicines will be available to help you.  Do not drive for 24 hours if you received a sedative, or until your health care provider approves. This information is not intended to replace advice given to you by your health care provider. Make sure you discuss any questions you have with your health care provider. Document Released: 03/28/2015 Document Revised: 03/14/2017 Document Reviewed: 03/28/2015 Elsevier Interactive Patient Education  2019 [employer].

## 2018-04-16 ENCOUNTER — Ambulatory Visit (INDEPENDENT_AMBULATORY_CARE_PROVIDER_SITE_OTHER): Payer: Medicaid Other | Admitting: Orthopedic Surgery

## 2018-05-09 ENCOUNTER — Encounter (INDEPENDENT_AMBULATORY_CARE_PROVIDER_SITE_OTHER): Payer: Self-pay | Admitting: Orthopedic Surgery

## 2018-05-09 ENCOUNTER — Ambulatory Visit (INDEPENDENT_AMBULATORY_CARE_PROVIDER_SITE_OTHER): Payer: Medicaid Other | Admitting: Orthopedic Surgery

## 2018-05-09 DIAGNOSIS — S43101S Unspecified dislocation of right acromioclavicular joint, sequela: Secondary | ICD-10-CM | POA: Diagnosis not present
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
## 2019-06-25 ENCOUNTER — Other Ambulatory Visit: Payer: Self-pay

## 2019-06-25 ENCOUNTER — Ambulatory Visit
Admission: EM | Admit: 2019-06-25 | Discharge: 2019-06-25 | Disposition: A | Discharge status: Home / Self Care | Payer: Medicaid Other | Attending: Emergency Medicine | Admitting: Emergency Medicine

## 2019-06-25 DIAGNOSIS — N481 Balanitis: Secondary | ICD-10-CM | POA: Insufficient documentation

## 2019-06-25 DIAGNOSIS — N485 Ulcer of penis: Secondary | ICD-10-CM | POA: Diagnosis present

## 2019-06-25 MED ORDER — CLOTRIMAZOLE 1 % EX CREA: TOPICAL_CREAM | CUTANEOUS | 0 refills | Status: AC

## 2019-06-25 NOTE — ED Provider Notes (Signed)
[facility]   [id number] 06/25/19 Arrival Time: 1851   CC: Penile pain  SUBJECTIVE:  Bryce Lee is a 29 y.o. adult who presents with swelling, and pain to foreskin of penis and white discharge over head of penis x 1 week.  Also mentions sore on penis x 1 week.  Denies precipitating event or trauma, but patient is an uncircumcised male.  Reports having two male partners in the past 6 months.  Previous partner with possible hx of HSV.  Denies similar symptoms in the past.  Had an online provider visit and was prescribed acyclovir.  Denies improvement in symptoms despite medication.  Symptoms made worse with retraction of the foreskin.  However, denies color changes, inability to retract foreskin, or inability to urinate.  Denies fever, chills, nausea, vomiting, abdominal or pelvic pain, testicular swelling or pain.     ROS: As per HPI.  All other pertinent ROS negative.     Past Medical History:  Diagnosis Date  . Fracture of distal fibula 10/13/2017   left   Past Surgical History:  Procedure Laterality Date  . NO PAST SURGERIES    . ORIF ANKLE FRACTURE Left 10/23/2017   Procedure: OPEN REDUCTION INTERNAL FIXATION (ORIF) ANKLE FRACTURE;  Surgeon: Jodi Geralds, MD;  Location: [facility];  Service: Orthopedics;  Laterality: Left;   No Known Allergies No current facility-administered medications on file prior to encounter.   Current Outpatient Medications on File Prior to Encounter  Medication Sig Dispense Refill  . acyclovir (ZOVIRAX) 200 MG capsule Take 200 mg by mouth 5 (five) times daily.    Marland Kitchen aspirin EC 325 MG tablet Take 1 tablet (325 mg total) by mouth daily. Take x  3 weeks post op to decrease risk of blood clots. 30 tablet 0  . ibuprofen (ADVIL,MOTRIN) 400 MG tablet Take 400 mg by mouth every 6 (six) hours as needed.    Marland Kitchen oxyCODONE-acetaminophen (PERCOCET/ROXICET) 5-325 MG tablet Take 1-2 tablets by mouth every 6 (six) hours as needed for  severe pain. 30 tablet 0  . traMADol (ULTRAM) 50 MG tablet Take 1 tablet (50 mg total) by mouth every 6 (six) hours as needed. 15 tablet 0   Social History   Socioeconomic History  . Marital status: Single    Spouse name: Not on file  . Number of children: Not on file  . Years of education: Not on file  . Highest education level: Not on file  Occupational History  . Not on file  Tobacco Use  . Smoking status: Current Every Day Smoker    Packs/day: 0.50    Years: 13.00    Pack years: 6.50    Types: Cigarettes  . Smokeless tobacco: Never Used  . Tobacco comment: pt cut down to half pack per day  Substance and Sexual Activity  . Alcohol use: Yes    Comment: occasionally  . Drug use: No  . Sexual activity: Not on file  Other Topics Concern  . Not on file  Social History Narrative   ** Merged History Encounter **       Social Determinants of Health   Financial Resource Strain:   . Difficulty of Paying Living Expenses:   Food Insecurity:   . Worried About Programme researcher, broadcasting/film/video in the Last Year:   . Barista in the Last Year:   Transportation Needs:   . Freight forwarder (Medical):   Marland Kitchen Lack of Transportation (Non-Medical):   Physical Activity:   .  Days of Exercise per Week:   . Minutes of Exercise per Session:   Stress:   . Feeling of Stress :   Social Connections:   . Frequency of Communication with Friends and Family:   . Frequency of Social Gatherings with Friends and Family:   . Attends Religious Services:   . Active Member of Clubs or Organizations:   . Attends Archivist Meetings:   Marland Kitchen Marital Status:   Intimate Partner Violence:   . Fear of Current or Ex-Partner:   . Emotionally Abused:   Marland Kitchen Physically Abused:   . Sexually Abused:    Family History  Problem Relation Age of Onset  . Diabetes Mother   . Healthy Father     OBJECTIVE:  Vitals:   06/25/19 1909  BP: (!) 146/87  Pulse: (!) 102  Resp: 18  Temp: 99.4 F (37.4 C)    SpO2: 95%     General appearance: alert, NAD, appears stated age Head: NCAT Throat: lips, mucosa, and tongue normal; teeth and gums normal Lungs: normal respiratory effort GU: Chaperone Katy RT.  Uncircumcised male; erythema, and swelling to the foreskin with white discharge present around the glands penis; no inguinal LAD; superficial ulcer with erythematous base located on the proximal shaft of the penis in the 6 o'clock position, NTTP, no obvious discharge; testicles appear symmetrical in size  Skin: warm and dry Psychological:  Alert and cooperative. Anxious mood and affect.  ASSESSMENT & PLAN:  1. Balanitis   2. Ulcer of penis     Meds ordered this encounter  Medications  . clotrimazole (LOTRIMIN) 1 % cream    Sig: Apply to affected area 2 times daily    Dispense:  60 g    Refill:  0    Order Specific Question:   Supervising Provider    Answer:   Raylene Everts [[id number]]    Pending: Labs Reviewed  HSV CULTURE AND TYPING  HIV ANTIBODY (ROUTINE TESTING W REFLEX)  RPR  CYTOLOGY, (ORAL, ANAL, URETHRAL) ANCILLARY ONLY   HSV and urethral swab sent HIV and RPR blood test ordered Someone will contact you regarding abnormal results  Ensure genital cleansing with retraction of foreskin Twice daily bathing of the affected area with saline solution can be helpful Clotrimazole 1% cream prescribed.  Use twice daily for 7-14 days Follow up with PCP or urology for further evaluation  Return or go to the ER if you have any new or worsening symptoms such as pain, increased redness or swelling of foreskin, inability to retract foreskin, etc...  Instructed patient to go to the ED or call 911 if foreskin was unable to retract, or became stuck while retracted.    Reviewed expectations re: course of current medical issues. Questions answered. Outlined signs and symptoms indicating need for more acute intervention. Patient verbalized understanding. After Visit Summary  given.       Lestine Box, PA-C 06/25/19 1955

## 2019-06-25 NOTE — ED Triage Notes (Signed)
Pt presents with complaints of tenderness to head of penis and a white "cottage cheese" discharge. Reports he has been in contact with herpes.

## 2019-06-25 NOTE — Discharge Instructions (Signed)
HSV and urethral swab sent HIV and RPR blood test ordered Someone will contact you regarding abnormal results  Ensure genital cleansing with retraction of foreskin Twice daily bathing of the affected area with saline solution can be helpful Clotrimazole 1% cream prescribed.  Use twice daily for 7-14 days Follow up with PCP or urology for further evaluation  Return or go to the ER if you have any new or worsening symptoms such as pain, increased redness or swelling of foreskin, inability to retract foreskin, etc..Marland Kitchen

## 2019-06-26 LAB — RPR: RPR Ser Ql: REACTIVE — AB

## 2019-06-26 LAB — RPR, QUANT+TP ABS (REFLEX)
Rapid Plasma Reagin, Quant: 1:32 {titer} — ABNORMAL HIGH
T Pallidum Abs: REACTIVE — AB

## 2019-06-26 LAB — HIV ANTIBODY (ROUTINE TESTING W REFLEX): HIV Screen 4th Generation wRfx: NONREACTIVE

## 2019-06-27 ENCOUNTER — Ambulatory Visit
Admission: EM | Admit: 2019-06-27 | Discharge: 2019-06-27 | Disposition: A | Discharge status: Home / Self Care | Payer: Medicaid Other | Attending: Emergency Medicine | Admitting: Emergency Medicine

## 2019-06-27 ENCOUNTER — Telehealth: Payer: Self-pay

## 2019-06-27 ENCOUNTER — Other Ambulatory Visit: Payer: Self-pay

## 2019-06-27 DIAGNOSIS — A539 Syphilis, unspecified: Secondary | ICD-10-CM | POA: Diagnosis not present

## 2019-06-27 MED ORDER — PENICILLIN G BENZATHINE 1200000 UNIT/2ML IM SUSP
2.4000 10*6.[IU] | Freq: Once | INTRAMUSCULAR | Status: AC
  Administered 2019-06-27: 2.4 10*6.[IU] via INTRAMUSCULAR

## 2019-06-27 NOTE — ED Triage Notes (Signed)
Pt here for treatment of std  

## 2019-06-28 LAB — CYTOLOGY, (ORAL, ANAL, URETHRAL) ANCILLARY ONLY
Chlamydia: NEGATIVE
Neisseria Gonorrhea: NEGATIVE
Trichomonas: POSITIVE — AB

## 2019-06-28 MED ORDER — METRONIDAZOLE 500 MG PO TABS: 500.0000 mg | ORAL_TABLET | Freq: Two times a day (BID) | ORAL | 0 refills | Status: AC

## 2019-06-30 LAB — HSV CULTURE AND TYPING

## 2019-07-05 ENCOUNTER — Telehealth (HOSPITAL_COMMUNITY): Payer: Self-pay

## 2019-07-05 NOTE — Telephone Encounter (Signed)
Pt returned call- aware of results.

## 2019-10-31 ENCOUNTER — Encounter: Payer: Self-pay | Admitting: *Deleted

## 2019-11-21 DIAGNOSIS — R002 Palpitations: Secondary | ICD-10-CM | POA: Insufficient documentation

## 2019-11-21 DIAGNOSIS — Z7189 Other specified counseling: Secondary | ICD-10-CM | POA: Insufficient documentation

## 2019-11-21 NOTE — Progress Notes (Deleted)
Cardiology Office Note   Date:  11/21/2019   ID:  Bryce Lee, DOB [birth year], MRN [id number]  PCP:  Vertis Kelch, NP  Cardiologist:   Rollene Rotunda, MD Referring:  ***  No chief complaint on file.     History of Present Illness: Bryce Lee is a 29 y.o. adult who is referred by *** for evaluation of palpitations.  ***   Past Medical History:  Diagnosis Date  . Fracture of distal fibula 10/13/2017   left    Past Surgical History:  Procedure Laterality Date  . NO PAST SURGERIES    . ORIF ANKLE FRACTURE Left 10/23/2017   Procedure: OPEN REDUCTION INTERNAL FIXATION (ORIF) ANKLE FRACTURE;  Surgeon: Jodi Geralds, MD;  Location: [facility];  Service: Orthopedics;  Laterality: Left;     Current Outpatient Medications  Medication Sig Dispense Refill  . acyclovir (ZOVIRAX) 200 MG capsule Take 200 mg by mouth 5 (five) times daily.    Marland Kitchen aspirin EC 325 MG tablet Take 1 tablet (325 mg total) by mouth daily. Take x  3 weeks post op to decrease risk of blood clots. 30 tablet 0  . clotrimazole (LOTRIMIN) 1 % cream Apply to affected area 2 times daily 60 g 0  . ibuprofen (ADVIL,MOTRIN) 400 MG tablet Take 400 mg by mouth every 6 (six) hours as needed.    . metroNIDAZOLE (FLAGYL) 500 MG tablet Take 1 tablet (500 mg total) by mouth 2 (two) times daily. 14 tablet 0  . oxyCODONE-acetaminophen (PERCOCET/ROXICET) 5-325 MG tablet Take 1-2 tablets by mouth every 6 (six) hours as needed for severe pain. 30 tablet 0  . traMADol (ULTRAM) 50 MG tablet Take 1 tablet (50 mg total) by mouth every 6 (six) hours as needed. 15 tablet 0   No current facility-administered medications for this visit.    Allergies:   Patient has no known allergies.    Social History:  The patient  reports that he has been smoking cigarettes. He has a 6.50 pack-year smoking history. He has never used smokeless tobacco. He reports current alcohol use. He reports that he does not use  drugs.   Family History:  The patient's ***family history includes Diabetes in his mother; Healthy in his father.    ROS:  Please see the history of present illness.   Otherwise, review of systems are positive for {NONE DEFAULTED:18576::"none"}.   All other systems are reviewed and negative.    PHYSICAL EXAM: VS:  There were no vitals taken for this visit. , BMI There is no height or weight on file to calculate BMI. GENERAL:  Well appearing HEENT:  Pupils equal round and reactive, fundi not visualized, oral mucosa unremarkable NECK:  No jugular venous distention, waveform within normal limits, carotid upstroke brisk and symmetric, no bruits, no thyromegaly LYMPHATICS:  No cervical, inguinal adenopathy LUNGS:  Clear to auscultation bilaterally BACK:  No CVA tenderness CHEST:  Unremarkable HEART:  PMI not displaced or sustained,S1 and S2 within normal limits, no S3, no S4, no clicks, no rubs, *** murmurs ABD:  Flat, positive bowel sounds normal in frequency in pitch, no bruits, no rebound, no guarding, no midline pulsatile mass, no hepatomegaly, no splenomegaly EXT:  2 plus pulses throughout, no edema, no cyanosis no clubbing SKIN:  No rashes no nodules NEURO:  Cranial nerves II through XII grossly intact, motor grossly intact throughout PSYCH:  Cognitively intact, oriented to person place and time    EKG:  EKG {ACTION; IS/IS BHA:19379024}  ordered today. The ekg ordered today demonstrates ***   Recent Labs: No results found for requested labs within last 8760 hours.    Lipid Panel No results found for: CHOL, TRIG, HDL, CHOLHDL, VLDL, LDLCALC, LDLDIRECT    Wt Readings from Last 3 Encounters:  04/09/18 201 lb (91.2 kg)  10/23/17 212 lb (96.2 kg)  10/13/17 210 lb (95.3 kg)      Other studies Reviewed: Additional studies/ records that were reviewed today include: ***. Review of the above records demonstrates:  Please see elsewhere in the note.  ***   ASSESSMENT AND  PLAN:  PALPITATIONS:  ***  COVID EDUCATION:  ***  Current medicines are reviewed at length with the patient today.  The patient {ACTIONS; HAS/DOES NOT HAVE:19233} concerns regarding medicines.  The following changes have been made:  {PLAN; NO CHANGE:13088:s}  Labs/ tests ordered today include: *** No orders of the defined types were placed in this encounter.    Disposition:   FU with ***    Signed, Rollene Rotunda, MD  11/21/2019 8:26 PM    [facility]

## 2019-11-22 ENCOUNTER — Ambulatory Visit: Payer: Medicaid Other | Admitting: Cardiology

## 2019-12-13 ENCOUNTER — Institutional Professional Consult (permissible substitution): Payer: Medicaid Other | Admitting: Pulmonary Disease

## 2022-06-08 ENCOUNTER — Emergency Department (HOSPITAL_COMMUNITY): Payer: Medicaid Other

## 2022-06-08 ENCOUNTER — Other Ambulatory Visit: Payer: Self-pay

## 2022-06-08 ENCOUNTER — Emergency Department (HOSPITAL_COMMUNITY)
Admission: EM | Admit: 2022-06-08 | Discharge: 2022-06-08 | Disposition: A | Discharge status: Home / Self Care | Payer: Medicaid Other | Attending: Emergency Medicine | Admitting: Emergency Medicine

## 2022-06-08 DIAGNOSIS — Y92410 Unspecified street and highway as the place of occurrence of the external cause: Secondary | ICD-10-CM | POA: Insufficient documentation

## 2022-06-08 DIAGNOSIS — Z7982 Long term (current) use of aspirin: Secondary | ICD-10-CM | POA: Diagnosis not present

## 2022-06-08 DIAGNOSIS — M25511 Pain in right shoulder: Secondary | ICD-10-CM | POA: Diagnosis present

## 2022-06-08 DIAGNOSIS — S43101A Unspecified dislocation of right acromioclavicular joint, initial encounter: Secondary | ICD-10-CM

## 2022-06-08 DIAGNOSIS — M542 Cervicalgia: Secondary | ICD-10-CM | POA: Insufficient documentation

## 2022-06-08 MED ORDER — HYDROCODONE-ACETAMINOPHEN 5-325 MG PO TABS
1.0000 | ORAL_TABLET | Freq: Once | ORAL | Status: AC
  Administered 2022-06-08: 1 via ORAL
  Filled 2022-06-08: qty 1

## 2022-06-08 MED ORDER — IBUPROFEN 600 MG PO TABS: 600.0000 mg | ORAL_TABLET | Freq: Three times a day (TID) | ORAL | 0 refills | Status: AC

## 2022-06-08 MED ORDER — OXYCODONE-ACETAMINOPHEN 5-325 MG PO TABS: 1.0000 | ORAL_TABLET | Freq: Four times a day (QID) | ORAL | 0 refills | Status: DC | PRN

## 2022-06-08 MED ORDER — OXYCODONE-ACETAMINOPHEN 5-325 MG PO TABS
1.0000 | ORAL_TABLET | Freq: Once | ORAL | Status: AC
  Administered 2022-06-08: 1 via ORAL
  Filled 2022-06-08: qty 1
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
## 2022-06-08 NOTE — ED Notes (Signed)
Ice applied to shoulder

## 2022-06-08 NOTE — ED Notes (Signed)
Patient transported to MRI 

## 2022-06-08 NOTE — Discharge Instructions (Addendum)
You have an injury that may require surgery as discussed.  Wear the sling provided to help avoid movement of your arm.  Continue using an ice pack as much as is comfortable for the next several days to help with pain and swelling.    You may take the oxycodone prescribed for pain relief.  This will make you drowsy - do not drive within 4 hours of taking this medication.  Ibuprofen will also help with pain and swelling.   I would like you to get seen by orthopedics within the next several days for further management of your injury
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
## 2022-07-01 ENCOUNTER — Ambulatory Visit (INDEPENDENT_AMBULATORY_CARE_PROVIDER_SITE_OTHER): Payer: Medicaid Other | Admitting: Orthopaedic Surgery

## 2022-07-01 DIAGNOSIS — M25511 Pain in right shoulder: Secondary | ICD-10-CM | POA: Diagnosis not present

## 2022-07-01 DIAGNOSIS — G8929 Other chronic pain: Secondary | ICD-10-CM | POA: Diagnosis not present

## 2022-07-01 MED ORDER — HYDROCODONE-ACETAMINOPHEN 5-325 MG PO TABS: 1.0000 | ORAL_TABLET | Freq: Two times a day (BID) | ORAL | 0 refills | Status: DC | PRN
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
## 2022-07-04 ENCOUNTER — Telehealth: Payer: Self-pay | Admitting: Orthopaedic Surgery

## 2022-07-04 NOTE — Telephone Encounter (Signed)
Patient would like to do PT in [place] please advise

## 2022-07-05 ENCOUNTER — Other Ambulatory Visit: Payer: Self-pay

## 2022-07-05 ENCOUNTER — Telehealth: Payer: Self-pay | Admitting: Physician Assistant

## 2022-07-05 ENCOUNTER — Other Ambulatory Visit: Payer: Self-pay | Admitting: Physician Assistant

## 2022-07-05 ENCOUNTER — Telehealth: Payer: Self-pay

## 2022-07-05 DIAGNOSIS — G8929 Other chronic pain: Secondary | ICD-10-CM

## 2022-07-05 NOTE — Telephone Encounter (Signed)
yes

## 2022-07-05 NOTE — Telephone Encounter (Signed)
Way too early.  #20 for bid sent in on 4/5

## 2022-07-05 NOTE — Telephone Encounter (Signed)
Tried again.  No answer.

## 2022-07-05 NOTE — Telephone Encounter (Signed)
Pt called requesting to resend pain medication of hydrocodone. [facility] pharmacy states to pt that they don't have refill for this. Please call pt at [phone]. Medication was sent in on chart 4/5

## 2022-07-05 NOTE — Telephone Encounter (Signed)
Tried calling pt to advise. No answer and no voice mail 

## 2022-07-05 NOTE — Telephone Encounter (Signed)
Order placed for [facility]. Tried to call and notify patient but "call cannot be completed as dialed".

## 2022-07-05 NOTE — Telephone Encounter (Signed)
Patient called triage phone stating his hydrocodone needs a PA, he's not able to get it

## 2022-07-14 ENCOUNTER — Ambulatory Visit: Payer: No Typology Code available for payment source

## 2022-07-14 ENCOUNTER — Ambulatory Visit (HOSPITAL_COMMUNITY): Payer: No Typology Code available for payment source

## 2022-07-15 ENCOUNTER — Encounter (HOSPITAL_COMMUNITY): Payer: Medicaid Other | Admitting: Occupational Therapy

## 2022-07-25 ENCOUNTER — Ambulatory Visit (HOSPITAL_COMMUNITY): Payer: Medicaid Other | Admitting: Occupational Therapy

## 2022-07-26 ENCOUNTER — Ambulatory Visit (HOSPITAL_COMMUNITY): Payer: Medicaid Other | Attending: Orthopaedic Surgery | Admitting: Occupational Therapy

## 2022-07-26 ENCOUNTER — Other Ambulatory Visit: Payer: Self-pay

## 2022-07-26 DIAGNOSIS — R29898 Other symptoms and signs involving the musculoskeletal system: Secondary | ICD-10-CM | POA: Diagnosis present

## 2022-07-26 DIAGNOSIS — M25611 Stiffness of right shoulder, not elsewhere classified: Secondary | ICD-10-CM | POA: Insufficient documentation

## 2022-07-26 DIAGNOSIS — M25511 Pain in right shoulder: Secondary | ICD-10-CM | POA: Insufficient documentation

## 2022-07-26 NOTE — Patient Instructions (Signed)
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
## 2022-07-28 ENCOUNTER — Telehealth: Payer: Self-pay | Admitting: Orthopaedic Surgery
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
## 2022-07-28 NOTE — Telephone Encounter (Signed)
Joycie Peek Sales promotion account executive) called from [employer]. Lyla Son states she discharged pt today. She state pt had inappropriate behavior and need to explain more in detail of reasoning for discharge. Please call Carrie at [phone]

## 2022-07-29 NOTE — Telephone Encounter (Signed)
Ok, thanks.

## 2022-08-02 ENCOUNTER — Ambulatory Visit (HOSPITAL_COMMUNITY): Payer: Medicaid Other | Admitting: Occupational Therapy

## 2022-08-05 ENCOUNTER — Encounter (HOSPITAL_COMMUNITY): Payer: Medicaid Other | Admitting: Occupational Therapy

## 2022-08-09 ENCOUNTER — Encounter (HOSPITAL_COMMUNITY): Payer: Medicaid Other | Admitting: Occupational Therapy

## 2022-08-11 ENCOUNTER — Encounter (HOSPITAL_COMMUNITY): Payer: Medicaid Other | Admitting: Occupational Therapy

## 2022-08-11 ENCOUNTER — Ambulatory Visit: Payer: Medicaid Other | Admitting: Family Medicine

## 2022-08-11 ENCOUNTER — Encounter: Payer: Self-pay | Admitting: Family Medicine

## 2022-08-11 VITALS — BP 136/94 | HR 90 | Ht 67.0 in | Wt 212.0 lb

## 2022-08-11 DIAGNOSIS — Z1159 Encounter for screening for other viral diseases: Secondary | ICD-10-CM

## 2022-08-11 DIAGNOSIS — M25511 Pain in right shoulder: Secondary | ICD-10-CM | POA: Diagnosis not present

## 2022-08-11 DIAGNOSIS — E559 Vitamin D deficiency, unspecified: Secondary | ICD-10-CM | POA: Diagnosis not present

## 2022-08-11 DIAGNOSIS — Z131 Encounter for screening for diabetes mellitus: Secondary | ICD-10-CM

## 2022-08-11 DIAGNOSIS — Z1329 Encounter for screening for other suspected endocrine disorder: Secondary | ICD-10-CM

## 2022-08-11 DIAGNOSIS — Z13220 Encounter for screening for lipoid disorders: Secondary | ICD-10-CM

## 2022-08-11 DIAGNOSIS — G8929 Other chronic pain: Secondary | ICD-10-CM

## 2022-08-11 MED ORDER — TRAMADOL HCL 50 MG PO TABS: 50.0000 mg | ORAL_TABLET | Freq: Four times a day (QID) | ORAL | 0 refills | Status: DC | PRN

## 2022-08-11 MED ORDER — TRAMADOL HCL 50 MG PO TABS: 50.0000 mg | ORAL_TABLET | Freq: Four times a day (QID) | ORAL | 0 refills | Status: AC | PRN
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
## 2022-08-11 NOTE — Patient Instructions (Signed)

## 2022-08-11 NOTE — Assessment & Plan Note (Signed)
Tramadol 50 mg PRN, Advise patient to follow up with physical therapy and Orthopedics.  Explained to patient Non pharmacological interventions include the use of ice or heat, rest,The use of NSAIDs for pain management.  Follow up for worsening or persistent symptoms. Patient verbalizes understanding regarding plan of care and all questions answered.

## 2022-08-16 ENCOUNTER — Encounter (HOSPITAL_COMMUNITY): Payer: Medicaid Other | Admitting: Occupational Therapy

## 2022-08-18 ENCOUNTER — Encounter (HOSPITAL_COMMUNITY): Payer: Medicaid Other | Admitting: Occupational Therapy

## 2022-08-24 ENCOUNTER — Encounter (HOSPITAL_COMMUNITY): Payer: Medicaid Other | Admitting: Occupational Therapy

## 2022-08-25 ENCOUNTER — Encounter (HOSPITAL_COMMUNITY): Payer: Medicaid Other | Admitting: Occupational Therapy

## 2022-12-26 NOTE — Therapy (Signed)
[facility] at [facility] [address] [place], [place], [address] Phone: [phone]   Fax:  [phone]  Patient Details  Name: Izrael Peak MRN: [id number] Date of Birth: 04/28/1990 Referring Provider:  Vertis Kelch, NP  Encounter Date: 07/26/2022  OCCUPATIONAL THERAPY DISCHARGE SUMMARY  Visits from Start of Care: 1  Current functional level related to goals / functional outcomes: Unknown. Pt discharged following evaluation.   Remaining deficits: Unknown. Pt discharged following evaluation.    Education / Equipment: HEP provided   Plan: Patient discharged following evaluation due to inappropriate communication attempts with therapist. Pt understands and will not be returning to this clinic.      Trish Mage, OTR/L [facility] [phone], OT 12/26/2022, 8:11 AM  [facility] at [facility] [address] [place], [place], [address] Phone: [phone]   Fax:  [phone]
